# Patient Record
Sex: Male | Born: 1950 | Race: White | Hispanic: No | Smoking: Former smoker
Health system: Southern US, Community
[De-identification: ages and names within clinical notes are randomized; demographics above are authoritative.]

## PROBLEM LIST (undated history)

## (undated) DIAGNOSIS — I10 Essential (primary) hypertension: Secondary | ICD-10-CM

## (undated) DIAGNOSIS — A4902 Methicillin resistant Staphylococcus aureus infection, unspecified site: Secondary | ICD-10-CM

## (undated) DIAGNOSIS — G629 Polyneuropathy, unspecified: Secondary | ICD-10-CM

## (undated) DIAGNOSIS — E119 Type 2 diabetes mellitus without complications: Secondary | ICD-10-CM

## (undated) HISTORY — PX: PENILE PROSTHESIS IMPLANT: SHX240

---

## 2016-05-27 ENCOUNTER — Emergency Department (HOSPITAL_COMMUNITY): Payer: Worker's Compensation

## 2016-05-27 ENCOUNTER — Emergency Department (HOSPITAL_COMMUNITY)
Admission: EM | Admit: 2016-05-27 | Discharge: 2016-05-27 | Disposition: A | Discharge status: Home / Self Care | Payer: Worker's Compensation | Attending: Emergency Medicine | Admitting: Emergency Medicine

## 2016-05-27 ENCOUNTER — Encounter (HOSPITAL_COMMUNITY): Payer: Self-pay | Admitting: *Deleted

## 2016-05-27 DIAGNOSIS — Z23 Encounter for immunization: Secondary | ICD-10-CM | POA: Insufficient documentation

## 2016-05-27 DIAGNOSIS — Z87891 Personal history of nicotine dependence: Secondary | ICD-10-CM | POA: Insufficient documentation

## 2016-05-27 DIAGNOSIS — Y929 Unspecified place or not applicable: Secondary | ICD-10-CM | POA: Insufficient documentation

## 2016-05-27 DIAGNOSIS — T1490XA Injury, unspecified, initial encounter: Secondary | ICD-10-CM

## 2016-05-27 DIAGNOSIS — S80212A Abrasion, left knee, initial encounter: Secondary | ICD-10-CM | POA: Diagnosis not present

## 2016-05-27 DIAGNOSIS — M79642 Pain in left hand: Secondary | ICD-10-CM

## 2016-05-27 DIAGNOSIS — Z79899 Other long term (current) drug therapy: Secondary | ICD-10-CM | POA: Insufficient documentation

## 2016-05-27 DIAGNOSIS — Y990 Civilian activity done for income or pay: Secondary | ICD-10-CM | POA: Insufficient documentation

## 2016-05-27 DIAGNOSIS — S80211A Abrasion, right knee, initial encounter: Secondary | ICD-10-CM | POA: Insufficient documentation

## 2016-05-27 DIAGNOSIS — W1789XA Other fall from one level to another, initial encounter: Secondary | ICD-10-CM | POA: Insufficient documentation

## 2016-05-27 DIAGNOSIS — S50312A Abrasion of left elbow, initial encounter: Secondary | ICD-10-CM | POA: Diagnosis not present

## 2016-05-27 DIAGNOSIS — E1140 Type 2 diabetes mellitus with diabetic neuropathy, unspecified: Secondary | ICD-10-CM | POA: Diagnosis not present

## 2016-05-27 DIAGNOSIS — I10 Essential (primary) hypertension: Secondary | ICD-10-CM | POA: Diagnosis not present

## 2016-05-27 DIAGNOSIS — M25532 Pain in left wrist: Secondary | ICD-10-CM | POA: Diagnosis not present

## 2016-05-27 DIAGNOSIS — M25522 Pain in left elbow: Secondary | ICD-10-CM

## 2016-05-27 DIAGNOSIS — Y939 Activity, unspecified: Secondary | ICD-10-CM | POA: Insufficient documentation

## 2016-05-27 DIAGNOSIS — S8991XA Unspecified injury of right lower leg, initial encounter: Secondary | ICD-10-CM | POA: Diagnosis present

## 2016-05-27 HISTORY — DX: Type 2 diabetes mellitus without complications: E11.9

## 2016-05-27 HISTORY — DX: Methicillin resistant Staphylococcus aureus infection, unspecified site: A49.02

## 2016-05-27 HISTORY — DX: Essential (primary) hypertension: I10

## 2016-05-27 HISTORY — DX: Polyneuropathy, unspecified: G62.9

## 2016-05-27 MED ORDER — TETANUS-DIPHTH-ACELL PERTUSSIS 5-2.5-18.5 LF-MCG/0.5 IM SUSP
0.5000 mL | Freq: Once | INTRAMUSCULAR | Status: AC
  Administered 2016-05-27: 0.5 mL via INTRAMUSCULAR
  Filled 2016-05-27: qty 0.5

## 2016-05-27 MED ORDER — HYDROCODONE-ACETAMINOPHEN 5-325 MG PO TABS: 1.0000 | ORAL_TABLET | ORAL | 0 refills | Status: AC | PRN

## 2016-05-27 MED ORDER — HYDROCODONE-ACETAMINOPHEN 5-325 MG PO TABS
2.0000 | ORAL_TABLET | Freq: Once | ORAL | Status: AC
  Administered 2016-05-27: 2 via ORAL
  Filled 2016-05-27: qty 2

## 2016-05-27 NOTE — Discharge Instructions (Signed)
All of your imaging has been normal today. Please wear the splint for comfort. May take the pain medicine as needed for pain. If pain persist may follow up with orthopedics for repeat imaging for missed fracture. If  you develops any abdominal pain please return to clinic. Return to ED if symptoms worsen. Please follow up with your primary care doctor. May use ice for swelling. Keep arm elevated and get plenty or rest.

## 2016-05-27 NOTE — ED Notes (Signed)
Patient transported to X-ray 
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
